# Patient Record
Sex: Male | Born: 1965 | Hispanic: Yes | Marital: Single | Smoking: Never smoker
Health system: Southern US, Community
[De-identification: ages and names within clinical notes are randomized; demographics above are authoritative.]

## PROBLEM LIST (undated history)

## (undated) DIAGNOSIS — F419 Anxiety disorder, unspecified: Secondary | ICD-10-CM

## (undated) DIAGNOSIS — F4310 Post-traumatic stress disorder, unspecified: Secondary | ICD-10-CM

---

## 2016-02-29 DIAGNOSIS — Z5900 Homelessness unspecified: Secondary | ICD-10-CM

## 2016-02-29 NOTE — Congregational Nurse Program (Unsigned)
Congregational Nurse Program Note  Date of Encounter: 02/29/2016  Past Medical History: No past medical history on file.  Encounter Details:     CNP Questionnaire - 02/29/16 1405    Patient Demographics   Is this a new or existing patient? New   Patient is considered a/an Not Applicable   Race Other   Patient Assistance   Location of Patient Assistance Not Applicable   Patient's financial/insurance status Low Income;Self-Pay   Uninsured Patient Yes   Interventions Counseled to make appt. with provider   Patient referred to apply for the following financial assistance [employer]   Food insecurities addressed Provided food supplies   Transportation assistance No   Assistance securing medications No   Educational health offerings Chronic disease;Hypertension   Encounter Details   Primary purpose of visit Education/Health Concerns;Chronic Illness/Condition Visit   Was an Emergency Department visit averted? Not Applicable   Does patient have a medical provider? No   Patient referred to Clinic   Was a mental health screening completed? (GAINS tool) No   Does patient have dental issues? No   Does patient have vision issues? No   Since previous encounter, have you referred patient for abnormal blood pressure that resulted in a new diagnosis or medication change? No   Since previous encounter, have you referred patient for abnormal blood glucose that resulted in a new diagnosis or medication change? No   For Abstraction Use Only   Does patient have insurance? No       B/P check

## 2019-02-06 ENCOUNTER — Encounter (HOSPITAL_BASED_OUTPATIENT_CLINIC_OR_DEPARTMENT_OTHER): Payer: Self-pay | Admitting: Emergency Medicine

## 2019-02-06 ENCOUNTER — Other Ambulatory Visit: Payer: Self-pay

## 2019-02-06 ENCOUNTER — Emergency Department (HOSPITAL_BASED_OUTPATIENT_CLINIC_OR_DEPARTMENT_OTHER)
Admission: EM | Admit: 2019-02-06 | Discharge: 2019-02-06 | Disposition: A | Discharge status: Home / Self Care | Payer: Self-pay | Attending: Emergency Medicine | Admitting: Emergency Medicine

## 2019-02-06 ENCOUNTER — Emergency Department (HOSPITAL_BASED_OUTPATIENT_CLINIC_OR_DEPARTMENT_OTHER): Payer: Self-pay

## 2019-02-06 DIAGNOSIS — G43109 Migraine with aura, not intractable, without status migrainosus: Secondary | ICD-10-CM

## 2019-02-06 HISTORY — DX: Post-traumatic stress disorder, unspecified: F43.10

## 2019-02-06 HISTORY — DX: Anxiety disorder, unspecified: F41.9

## 2019-02-06 LAB — CBC WITH DIFFERENTIAL/PLATELET
Abs Immature Granulocytes: 0.03 10*3/uL (ref 0.00–0.07)
BASOS ABS: 0.1 10*3/uL (ref 0.0–0.1)
BASOS PCT: 1 %
Eosinophils Absolute: 0.2 10*3/uL (ref 0.0–0.5)
Eosinophils Relative: 3 %
HCT: 45.2 % (ref 39.0–52.0)
Hemoglobin: 14.1 g/dL (ref 13.0–17.0)
Immature Granulocytes: 0 %
Lymphocytes Relative: 28 %
Lymphs Abs: 2.1 10*3/uL (ref 0.7–4.0)
MCH: 27 pg (ref 26.0–34.0)
MCHC: 31.2 g/dL (ref 30.0–36.0)
MCV: 86.6 fL (ref 80.0–100.0)
Monocytes Absolute: 0.4 10*3/uL (ref 0.1–1.0)
Monocytes Relative: 5 %
NRBC: 0 % (ref 0.0–0.2)
Neutro Abs: 4.8 10*3/uL (ref 1.7–7.7)
Neutrophils Relative %: 63 %
PLATELETS: 167 10*3/uL (ref 150–400)
RBC: 5.22 MIL/uL (ref 4.22–5.81)
RDW: 12.9 % (ref 11.5–15.5)
WBC: 7.5 10*3/uL (ref 4.0–10.5)

## 2019-02-06 LAB — COMPREHENSIVE METABOLIC PANEL
ALBUMIN: 3.9 g/dL (ref 3.5–5.0)
ALT: 13 U/L (ref 0–44)
AST: 16 U/L (ref 15–41)
Alkaline Phosphatase: 43 U/L (ref 38–126)
Anion gap: 5 (ref 5–15)
BUN: 17 mg/dL (ref 6–20)
CHLORIDE: 104 mmol/L (ref 98–111)
CO2: 26 mmol/L (ref 22–32)
Calcium: 8.9 mg/dL (ref 8.9–10.3)
Creatinine, Ser: 0.97 mg/dL (ref 0.61–1.24)
GFR calc Af Amer: >60 mL/min (ref >60)
GFR calc non Af Amer: >60 mL/min (ref >60)
Glucose, Bld: 114 mg/dL — ABNORMAL HIGH (ref 70–99)
Potassium: 3.6 mmol/L (ref 3.5–5.1)
Sodium: 135 mmol/L (ref 135–145)
Total Bilirubin: 0.6 mg/dL (ref 0.3–1.2)
Total Protein: 6.7 g/dL (ref 6.5–8.1)

## 2019-02-06 MED ORDER — METOCLOPRAMIDE HCL 5 MG/ML IJ SOLN
10.0000 mg | Freq: Once | INTRAMUSCULAR | Status: AC
  Administered 2019-02-06: 10 mg via INTRAVENOUS
  Filled 2019-02-06: qty 2

## 2019-02-06 MED ORDER — DIPHENHYDRAMINE HCL 50 MG/ML IJ SOLN
25.0000 mg | Freq: Once | INTRAMUSCULAR | Status: AC
  Administered 2019-02-06: 25 mg via INTRAVENOUS
  Filled 2019-02-06: qty 1

## 2019-02-06 MED ORDER — SODIUM CHLORIDE 0.9 % IV BOLUS
1000.0000 mL | Freq: Once | INTRAVENOUS | Status: AC
  Administered 2019-02-06: 1000 mL via INTRAVENOUS

## 2019-02-06 MED ORDER — SODIUM CHLORIDE 0.9 % IV SOLN: INTRAVENOUS | Status: DC

## 2019-02-06 MED ORDER — DEXAMETHASONE SODIUM PHOSPHATE 10 MG/ML IJ SOLN
10.0000 mg | Freq: Once | INTRAMUSCULAR | Status: AC
  Administered 2019-02-06: 10 mg via INTRAVENOUS
  Filled 2019-02-06: qty 1

## 2019-02-06 NOTE — ED Provider Notes (Signed)
[facility] EMERGENCY DEPARTMENT Provider Note   CSN: [id number] Arrival date & time: 02/06/19  1444    History   Chief Complaint Chief Complaint  Patient presents with  . Headache  . Numbness    HPI Raymond Proctor is a 53 y.o. male.     Patient with a complaint of headache on her forehead and top of the head bilaterally.  Started this morning at about 9 in the morning initially had some visual flashes.  They have resolved had some tongue numbness and some numbness to both hands upper extremity which is still present.  The headache has persisted has had something similar to this in the past he was told that it may be a migraine.     Past Medical History:  Diagnosis Date  . Anxiety   . PTSD (post-traumatic stress disorder)     There are no active problems to display for this patient.   History reviewed. No pertinent surgical history.      Home Medications    Prior to Admission medications   Not on File    Family History No family history on file.  Social History Social History   Tobacco Use  . Smoking status: Never Smoker  . Smokeless tobacco: Never Used  Substance Use Topics  . Alcohol use: Not Currently  . Drug use: Never     Allergies   Patient has no known allergies.   Review of Systems Review of Systems  Constitutional: Negative for chills and fever.  HENT: Negative for congestion, rhinorrhea and sore throat.   Eyes: Positive for visual disturbance. Negative for photophobia and pain.  Respiratory: Negative for cough and shortness of breath.   Cardiovascular: Negative for chest pain and leg swelling.  Gastrointestinal: Negative for abdominal pain, diarrhea, nausea and vomiting.  Genitourinary: Negative for dysuria.  Musculoskeletal: Negative for back pain and neck pain.  Skin: Negative for rash.  Neurological: Positive for numbness and headaches. Negative for dizziness and light-headedness.  Hematological: Does not bruise/bleed  easily.  Psychiatric/Behavioral: Negative for confusion.     Physical Exam Updated Vital Signs BP (!) 141/90 (BP Location: Left Arm)   Pulse (!) 52   Temp 98.5 F (36.9 C) (Oral)   Resp 16   Ht 1.829 m (6')   Wt 90.7 kg   SpO2 99%   BMI 27.12 kg/m   Physical Exam Vitals signs and nursing note reviewed.  Constitutional:      Appearance: Normal appearance. He is well-developed.  HENT:     Head: Normocephalic and atraumatic.     Nose: No congestion.     Mouth/Throat:     Mouth: Mucous membranes are moist.  Eyes:     Extraocular Movements: Extraocular movements intact.     Conjunctiva/sclera: Conjunctivae normal.     Pupils: Pupils are equal, round, and reactive to light.  Neck:     Musculoskeletal: Normal range of motion and neck supple.  Cardiovascular:     Rate and Rhythm: Normal rate and regular rhythm.     Heart sounds: Normal heart sounds. No murmur.  Pulmonary:     Effort: Pulmonary effort is normal. No respiratory distress.     Breath sounds: Normal breath sounds.  Abdominal:     General: Bowel sounds are normal.     Palpations: Abdomen is soft.     Tenderness: There is no abdominal tenderness.  Musculoskeletal: Normal range of motion.  Skin:    General: Skin is warm and dry.  Capillary Refill: Capillary refill takes less than 2 seconds.  Neurological:     General: No focal deficit present.     Mental Status: He is alert and oriented to person, place, and time.     Cranial Nerves: Cranial nerve deficit present.     Sensory: Sensory deficit present.     Motor: No weakness.      ED Treatments / Results  Labs (all labs ordered are listed, but only abnormal results are displayed) Labs Reviewed  COMPREHENSIVE METABOLIC PANEL - Abnormal; Notable for the following components:      Result Value   Glucose, Bld 114 (*)    All other components within normal limits  CBC WITH DIFFERENTIAL/PLATELET    EKG EKG Interpretation  Date/Time:  Saturday February 06 2019 14:58:28 EST Ventricular Rate:  52 PR Interval:    QRS Duration: 132 QT Interval:  460 QTC Calculation: 428 R Axis:   95 Text Interpretation:  Sinus rhythm Consider left atrial enlargement Nonspecific intraventricular conduction delay No previous ECGs available Confirmed by Vanetta Mulders [phone]) on 02/06/2019 3:19:02 PM   Radiology Ct Head Wo Contrast  Result Date: 02/06/2019 CLINICAL DATA:  Headache EXAM: CT HEAD WITHOUT CONTRAST TECHNIQUE: Contiguous axial images were obtained from the base of the skull through the vertex without intravenous contrast. COMPARISON:  None. FINDINGS: Brain: No acute intracranial abnormality. Specifically, no hemorrhage, hydrocephalus, mass lesion, acute infarction, or significant intracranial injury. Vascular: No hyperdense vessel or unexpected calcification. Skull: No acute calvarial abnormality. Sinuses/Orbits: Mucosal thickening throughout the paranasal sinuses. No acute findings. Other: None IMPRESSION: No acute intracranial abnormality.  Chronic sinusitis. Electronically Signed   By: Charlett Nose M.D.   On: 02/06/2019 16:59    Procedures Procedures (including critical care time)  Medications Ordered in ED Medications  0.9 %  sodium chloride infusion ( Intravenous Hold 02/06/19 1715)  sodium chloride 0.9 % bolus 1,000 mL (0 mLs Intravenous Stopped 02/06/19 1715)  diphenhydrAMINE (BENADRYL) injection 25 mg (25 mg Intravenous Given 02/06/19 1600)  metoCLOPramide (REGLAN) injection 10 mg (10 mg Intravenous Given 02/06/19 1600)  dexamethasone (DECADRON) injection 10 mg (10 mg Intravenous Given 02/06/19 1559)     Initial Impression / Assessment and Plan / ED Course  I have reviewed the triage vital signs and the nursing notes.  Pertinent labs & imaging results that were available during my care of the patient were reviewed by me and considered in my medical decision making (see chart for details).        Patient head CT negative labs without any  significant abnormalities.  Treated with a migraine cocktail 1 L of fluid Decadron Benadryl and Reglan patient states symptoms improving significantly headache is improved but still present but all the numbness to the hands and tongue have resolved.  Clinically suspect patient has a history of complicated migraine.  Patient aware the Decadron will be long-acting.  And will help throughout the night.  Patient does not have to be back to work until Monday.  He will rest in the meantime.    Final Clinical Impressions(s) / ED Diagnoses   Final diagnoses:  Complicated migraine    ED Discharge Orders    None       Vanetta Mulders, MD 02/06/19 1730

## 2019-02-06 NOTE — Discharge Instructions (Addendum)
Rest today and tomorrow.  Return for any new or worse symptoms.  Symptoms seem to be related to a complicated migraine glad that you are feeling better.

## 2019-02-06 NOTE — ED Triage Notes (Addendum)
Pt reports headache, tongue numbness, and seeing flashes since 9am. The numbness lasted 2-3 minutes and has resolved. The headache persists. Info obtained via arabic interpreter.

## 2019-02-08 ENCOUNTER — Other Ambulatory Visit: Payer: Self-pay

## 2019-02-08 ENCOUNTER — Emergency Department (HOSPITAL_BASED_OUTPATIENT_CLINIC_OR_DEPARTMENT_OTHER): Payer: Self-pay

## 2019-02-08 ENCOUNTER — Emergency Department (HOSPITAL_BASED_OUTPATIENT_CLINIC_OR_DEPARTMENT_OTHER)
Admission: EM | Admit: 2019-02-08 | Discharge: 2019-02-08 | Disposition: A | Discharge status: Home / Self Care | Payer: Self-pay | Attending: Emergency Medicine | Admitting: Emergency Medicine

## 2019-02-08 ENCOUNTER — Encounter (HOSPITAL_BASED_OUTPATIENT_CLINIC_OR_DEPARTMENT_OTHER): Payer: Self-pay | Admitting: *Deleted

## 2019-02-08 DIAGNOSIS — R0789 Other chest pain: Secondary | ICD-10-CM | POA: Insufficient documentation

## 2019-02-08 DIAGNOSIS — R002 Palpitations: Secondary | ICD-10-CM | POA: Insufficient documentation

## 2019-02-08 LAB — TROPONIN I: Troponin I: <0.03 ng/mL (ref <0.03)

## 2019-02-08 LAB — URINALYSIS, ROUTINE W REFLEX MICROSCOPIC
Bilirubin Urine: NEGATIVE
Glucose, UA: NEGATIVE mg/dL
Hgb urine dipstick: NEGATIVE
Ketones, ur: NEGATIVE mg/dL
Leukocytes,Ua: NEGATIVE
Nitrite: NEGATIVE
Protein, ur: NEGATIVE mg/dL
Specific Gravity, Urine: >1.03 — ABNORMAL HIGH (ref 1.005–1.030)
pH: 5.5 (ref 5.0–8.0)

## 2019-02-08 LAB — RAPID URINE DRUG SCREEN, HOSP PERFORMED
Amphetamines: NOT DETECTED
BENZODIAZEPINES: NOT DETECTED
Barbiturates: NOT DETECTED
Cocaine: NOT DETECTED
Opiates: NOT DETECTED
Tetrahydrocannabinol: NOT DETECTED

## 2019-02-08 LAB — TSH: TSH: 1.957 u[IU]/mL (ref 0.350–4.500)

## 2019-02-08 MED ORDER — FAMOTIDINE 20 MG PO TABS
40.0000 mg | ORAL_TABLET | Freq: Once | ORAL | Status: AC
  Administered 2019-02-08: 40 mg via ORAL
  Filled 2019-02-08: qty 2

## 2019-02-08 MED ORDER — OMEPRAZOLE 20 MG PO CPDR: 20.0000 mg | DELAYED_RELEASE_CAPSULE | Freq: Every day | ORAL | 1 refills | Status: AC

## 2019-02-08 NOTE — ED Triage Notes (Signed)
Palpitations since last night. He was seen here for migraine 2 days ago.

## 2019-02-08 NOTE — ED Notes (Signed)
ED Provider at bedside. 

## 2019-02-08 NOTE — ED Provider Notes (Signed)
[facility] EMERGENCY DEPARTMENT Provider Note   CSN: [id number] Arrival date & time: 02/08/19  1522    History   Chief Complaint Chief Complaint  Patient presents with  . Palpitations    HPI Raymond Proctor is a 53 y.o. male.     HPI Patient reports he is felt like his heart has been racing since he was treated a day ago for his migraine.  Last night he reports his heart felt like it was beating fast.  He reports sometimes he feels like his chest has kind of a tight sensation to it.  No passing out episodes.  No shortness of breath.  Patient denies cough or fever.  He reports he has no known cardiac history.  No swelling in the legs.  Patient denies tobacco or alcohol use.  Patient reports he does drink coffee every day.  He reports his family history is unknown. Past Medical History:  Diagnosis Date  . Anxiety   . PTSD (post-traumatic stress disorder)     There are no active problems to display for this patient.   History reviewed. No pertinent surgical history.      Home Medications    Prior to Admission medications   Medication Sig Start Date End Date Taking? Authorizing Provider  omeprazole (PRILOSEC) 20 MG capsule Take 1 capsule (20 mg total) by mouth daily. 02/08/19   Arby Barrette, MD    Family History No family history on file.  Social History Social History   Tobacco Use  . Smoking status: Never Smoker  . Smokeless tobacco: Never Used  Substance Use Topics  . Alcohol use: Not Currently  . Drug use: Never     Allergies   Patient has no known allergies.   Review of Systems Review of Systems 10 Systems reviewed and are negative for acute change except as noted in the HPI.   Physical Exam Updated Vital Signs BP 121/75   Pulse 62   Temp 98.1 F (36.7 C) (Oral)   Resp 18   Ht 6' (1.829 m)   Wt 90.7 kg   SpO2 98%   BMI 27.12 kg/m   Physical Exam Constitutional:      Appearance: Normal appearance. He is well-developed.    HENT:     Head: Normocephalic and atraumatic.     Mouth/Throat:     Mouth: Mucous membranes are moist.     Pharynx: Oropharynx is clear.  Eyes:     Pupils: Pupils are equal, round, and reactive to light.  Neck:     Musculoskeletal: Neck supple.     Comments: No thyromegaly. Cardiovascular:     Rate and Rhythm: Normal rate and regular rhythm.     Heart sounds: Normal heart sounds.  Pulmonary:     Effort: Pulmonary effort is normal.     Breath sounds: Normal breath sounds.  Abdominal:     General: Bowel sounds are normal. There is no distension.     Palpations: Abdomen is soft.     Tenderness: There is no abdominal tenderness.  Musculoskeletal: Normal range of motion.  Skin:    General: Skin is warm and dry.  Neurological:     General: No focal deficit present.     Mental Status: He is alert and oriented to person, place, and time.     GCS: GCS eye subscore is 4. GCS verbal subscore is 5. GCS motor subscore is 6.     Coordination: Coordination normal.  Psychiatric:  Mood and Affect: Mood normal.      ED Treatments / Results  Labs (all labs ordered are listed, but only abnormal results are displayed) Labs Reviewed  URINALYSIS, ROUTINE W REFLEX MICROSCOPIC - Abnormal; Notable for the following components:      Result Value   Specific Gravity, Urine >1.030 (*)    All other components within normal limits  RAPID URINE DRUG SCREEN, HOSP PERFORMED  TROPONIN I  TSH    EKG EKG Interpretation  Date/Time:  Monday February 08 2019 15:36:34 EDT Ventricular Rate:  62 PR Interval:  130 QRS Duration: 118 QT Interval:  416 QTC Calculation: 422 R Axis:   60 Text Interpretation:  Normal sinus rhythm Non-specific intra-ventricular conduction delay Borderline ECG no sig change from previous Confirmed by Arby Barrette [phone]) on 02/08/2019 3:55:43 PM   Radiology Dg Chest 2 View  Result Date: 02/08/2019 CLINICAL DATA:  Chest pain and palpitations EXAM: CHEST - 2 VIEW  COMPARISON:  None. FINDINGS: The heart size and mediastinal contours are within normal limits. Both lungs are clear. The visualized skeletal structures are unremarkable. IMPRESSION: No active cardiopulmonary disease. Electronically Signed   By: Deatra Robinson M.D.   On: 02/08/2019 16:46    Procedures Procedures (including critical care time)  Medications Ordered in ED Medications  famotidine (PEPCID) tablet 40 mg (40 mg Oral Given 02/08/19 1612)     Initial Impression / Assessment and Plan / ED Course  I have reviewed the triage vital signs and the nursing notes.  Pertinent labs & imaging results that were available during my care of the patient were reviewed by me and considered in my medical decision making (see chart for details).        Patient clinically well in appearance.  Heart is regular with normal sinus rhythm.  Monitor shows rates in the low 60s.  EKG has no ischemic appearing changes.  Troponin is negative.  Patient symptoms do not sound suggestive of ischemia.  Patient does not have hypertension or other risk factors immediately identifiable.  His family history however is not recalled by him.  At this time I feel patient is stable for outpatient management and follow-up.  He will be counseled on getting a primary care doctor for routine health management.  Return precautions reviewed.  Counseled on avoiding caffeine and other stimulants.  Final Clinical Impressions(s) / ED Diagnoses   Final diagnoses:  Palpitations  Other chest pain    ED Discharge Orders         Ordered    omeprazole (PRILOSEC) 20 MG capsule  Daily     02/08/19 1716           Arby Barrette, MD 02/08/19 [phone]

## 2019-02-08 NOTE — Discharge Instructions (Signed)
1.  Start taking Prilosec daily for 2 weeks. 2.  Stop drinking coffee or other foods that contain caffeine. 3.  Schedule a follow-up with a family doctor.  You may go to [facility] as listed in your discharge instructions or use the referral number to find a family doctor. 4.  Return to the emergency department if your symptoms are worsening or changing.

## 2020-07-15 IMAGING — CT CT HEAD W/O CM
3 of 4 series · 16 of 47 positions shown, 19 images · non-contrast
Comparison: None.

CLINICAL DATA: Headache

EXAM:
CT HEAD WITHOUT CONTRAST
TECHNIQUE: Contiguous axial images were obtained from the base of the skull
through the vertex without intravenous contrast.

[Series 2: head wo · axial · 0.51mm/px · z∈[-179,-44]mm · 10 of 33 slices shown, 13 images]
[im 3/33  brain]
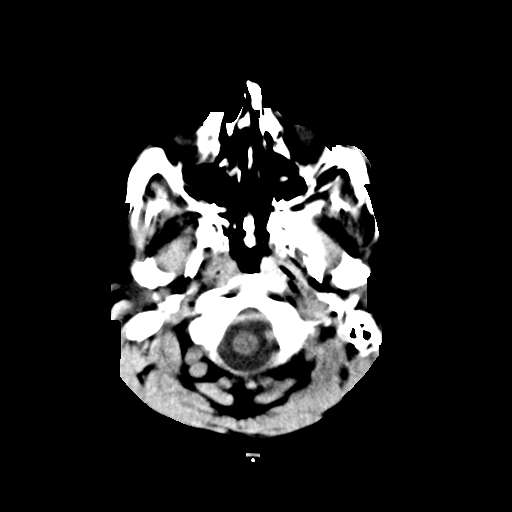
[im 3/33  bone]
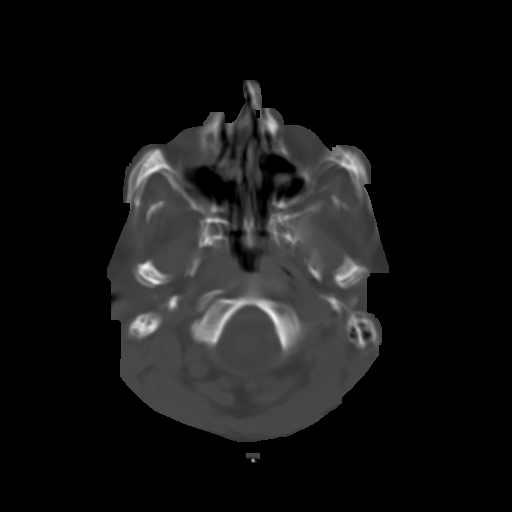
[im 5/33  brain]
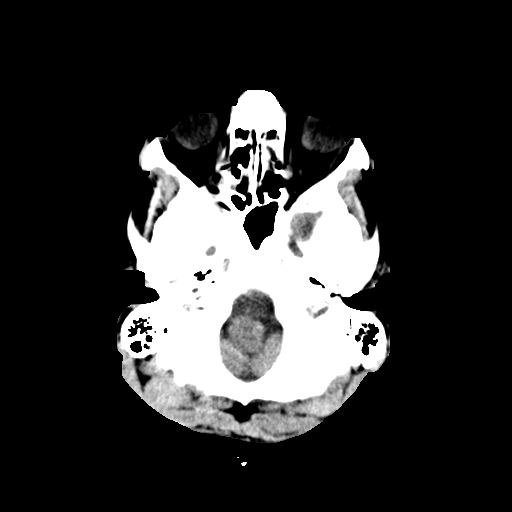
[im 10/33  brain]
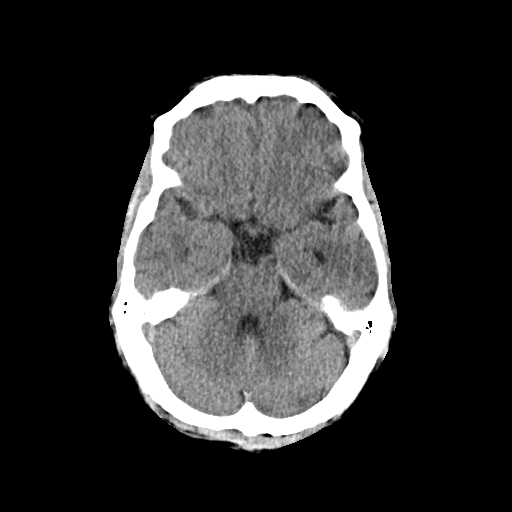
[im 12/33  brain]
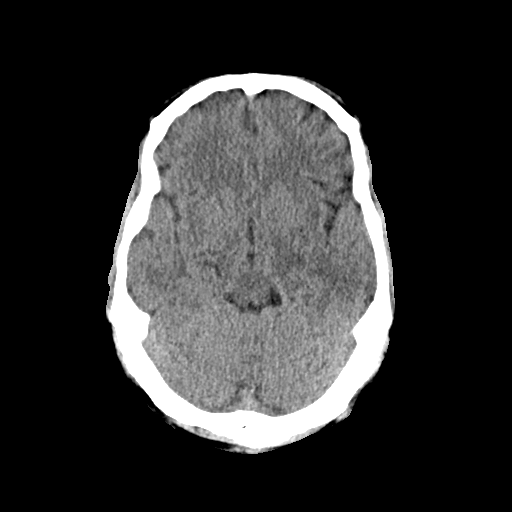
[im 14/33  brain]
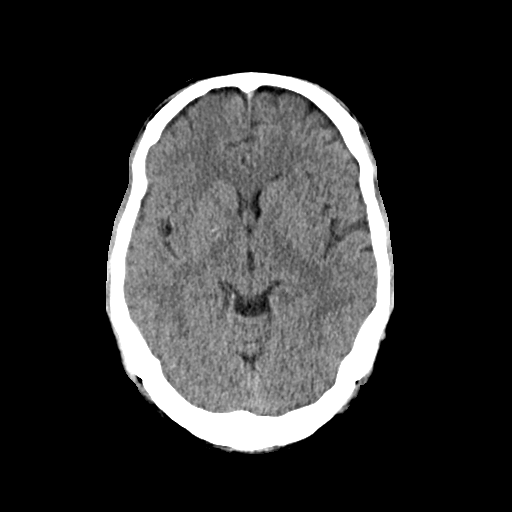
[im 14/33  bone]
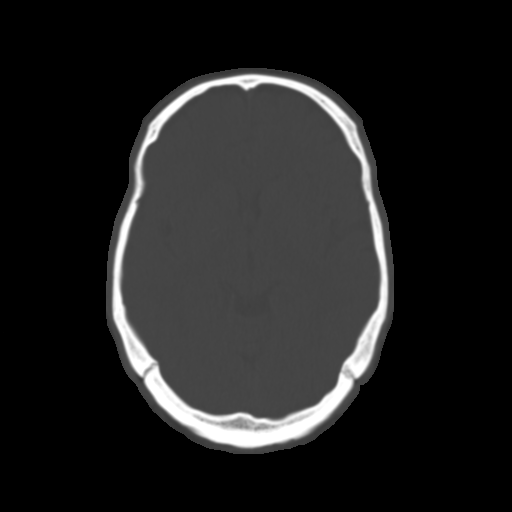
[im 19/33  brain]
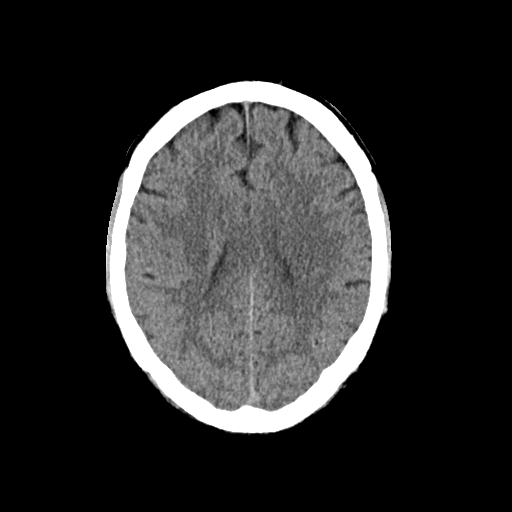
[im 21/33  brain]
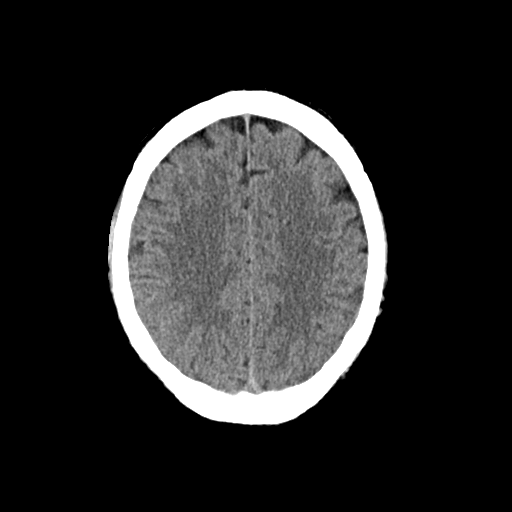
[im 23/33  brain]
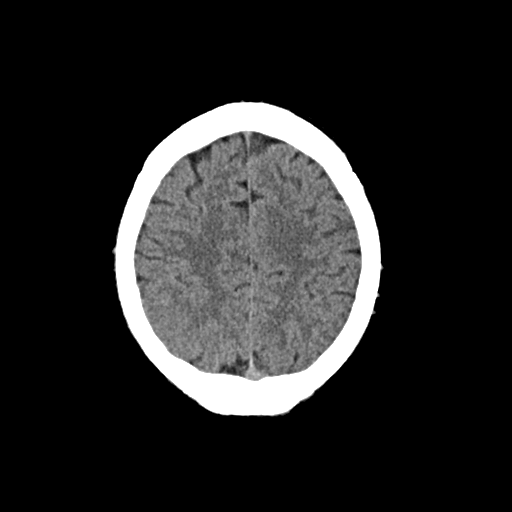
[im 28/33  brain]
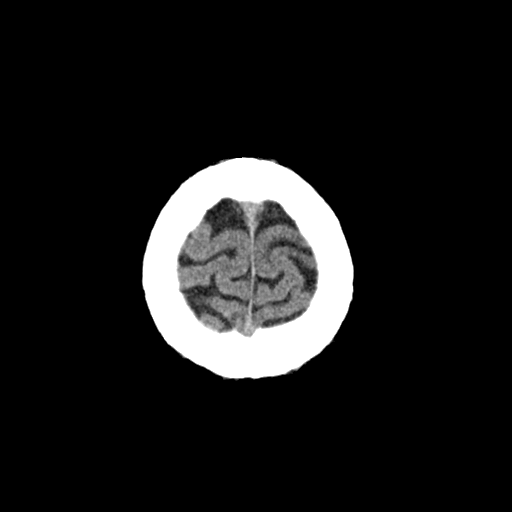
[im 28/33  bone]
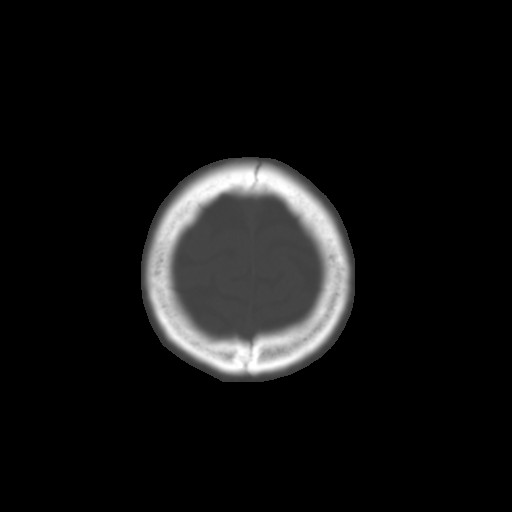
[im 30/33  brain]
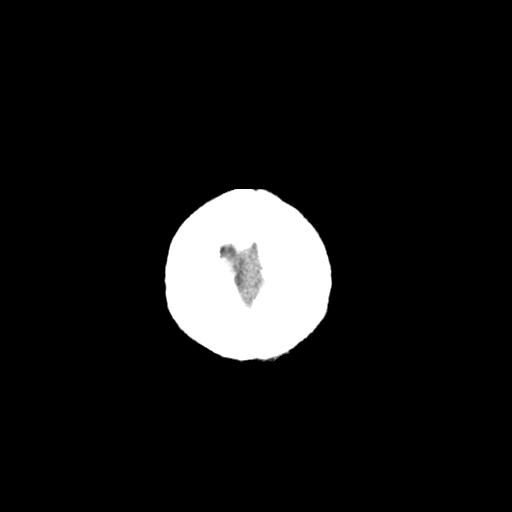

[Series 6: coronal soft · coronal · 0.39mm/px · 3 of 66 slices shown]
[im 22/66  brain]
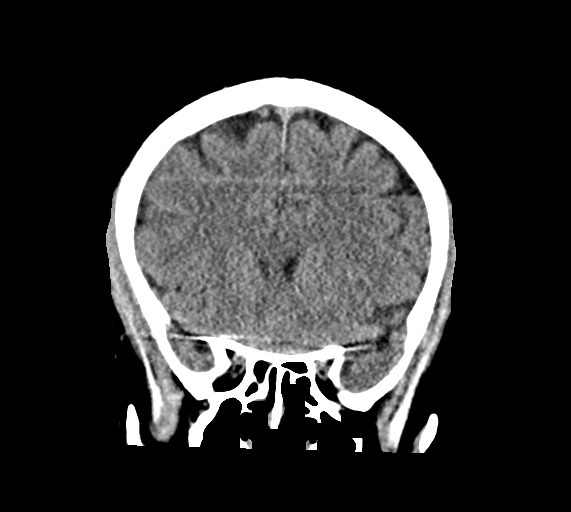
[im 29/66  brain]
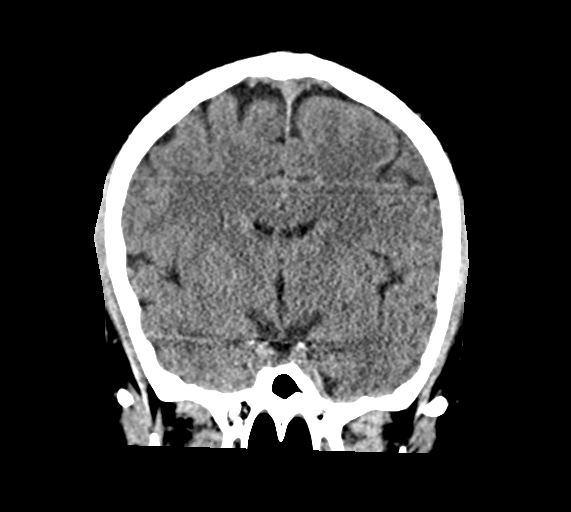
[im 37/66  brain]
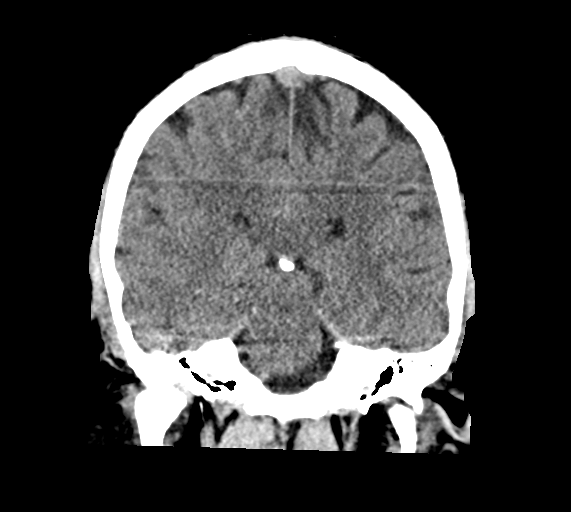

[Series 7: sag soft · sagittal · 0.39mm/px · 3 of 59 slices shown]
[im 20/59  brain]
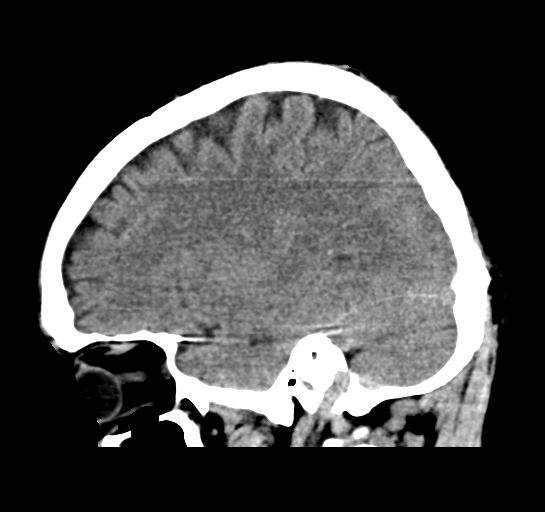
[im 30/59  brain]
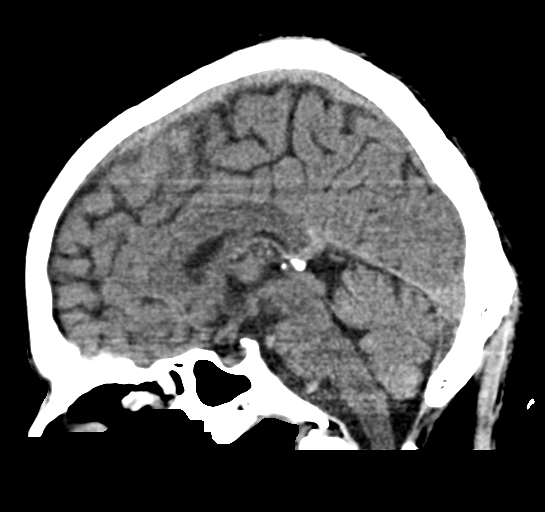
[im 39/59  brain]
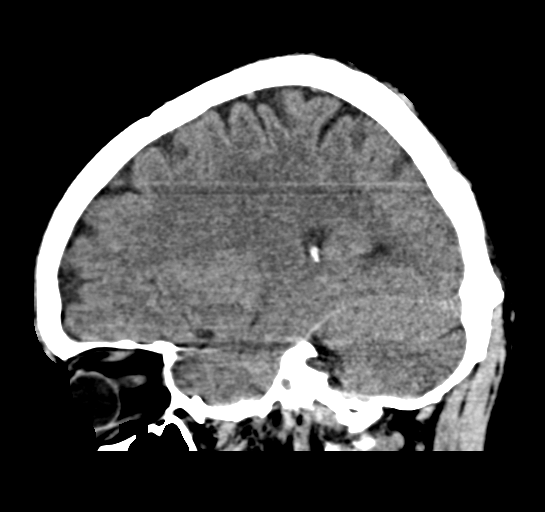

[16 of 47 positions shown; findings below may reference images not displayed]

FINDINGS: Brain: No acute intracranial abnormality. Specifically, no
hemorrhage, hydrocephalus, mass lesion, acute infarction, or
significant intracranial injury.

Vascular: No hyperdense vessel or unexpected calcification.

Skull: No acute calvarial abnormality.

Sinuses/Orbits: Mucosal thickening throughout the paranasal sinuses.
No acute findings.

Other: None
IMPRESSION: No acute intracranial abnormality.  Chronic sinusitis.

## 2020-07-17 IMAGING — CR CHEST - 2 VIEW
2 series · 2 of 2 positions shown · non-contrast
Comparison: None.

CLINICAL DATA: Chest pain and palpitations

EXAM:
CHEST - 2 VIEW

[w chest pa]
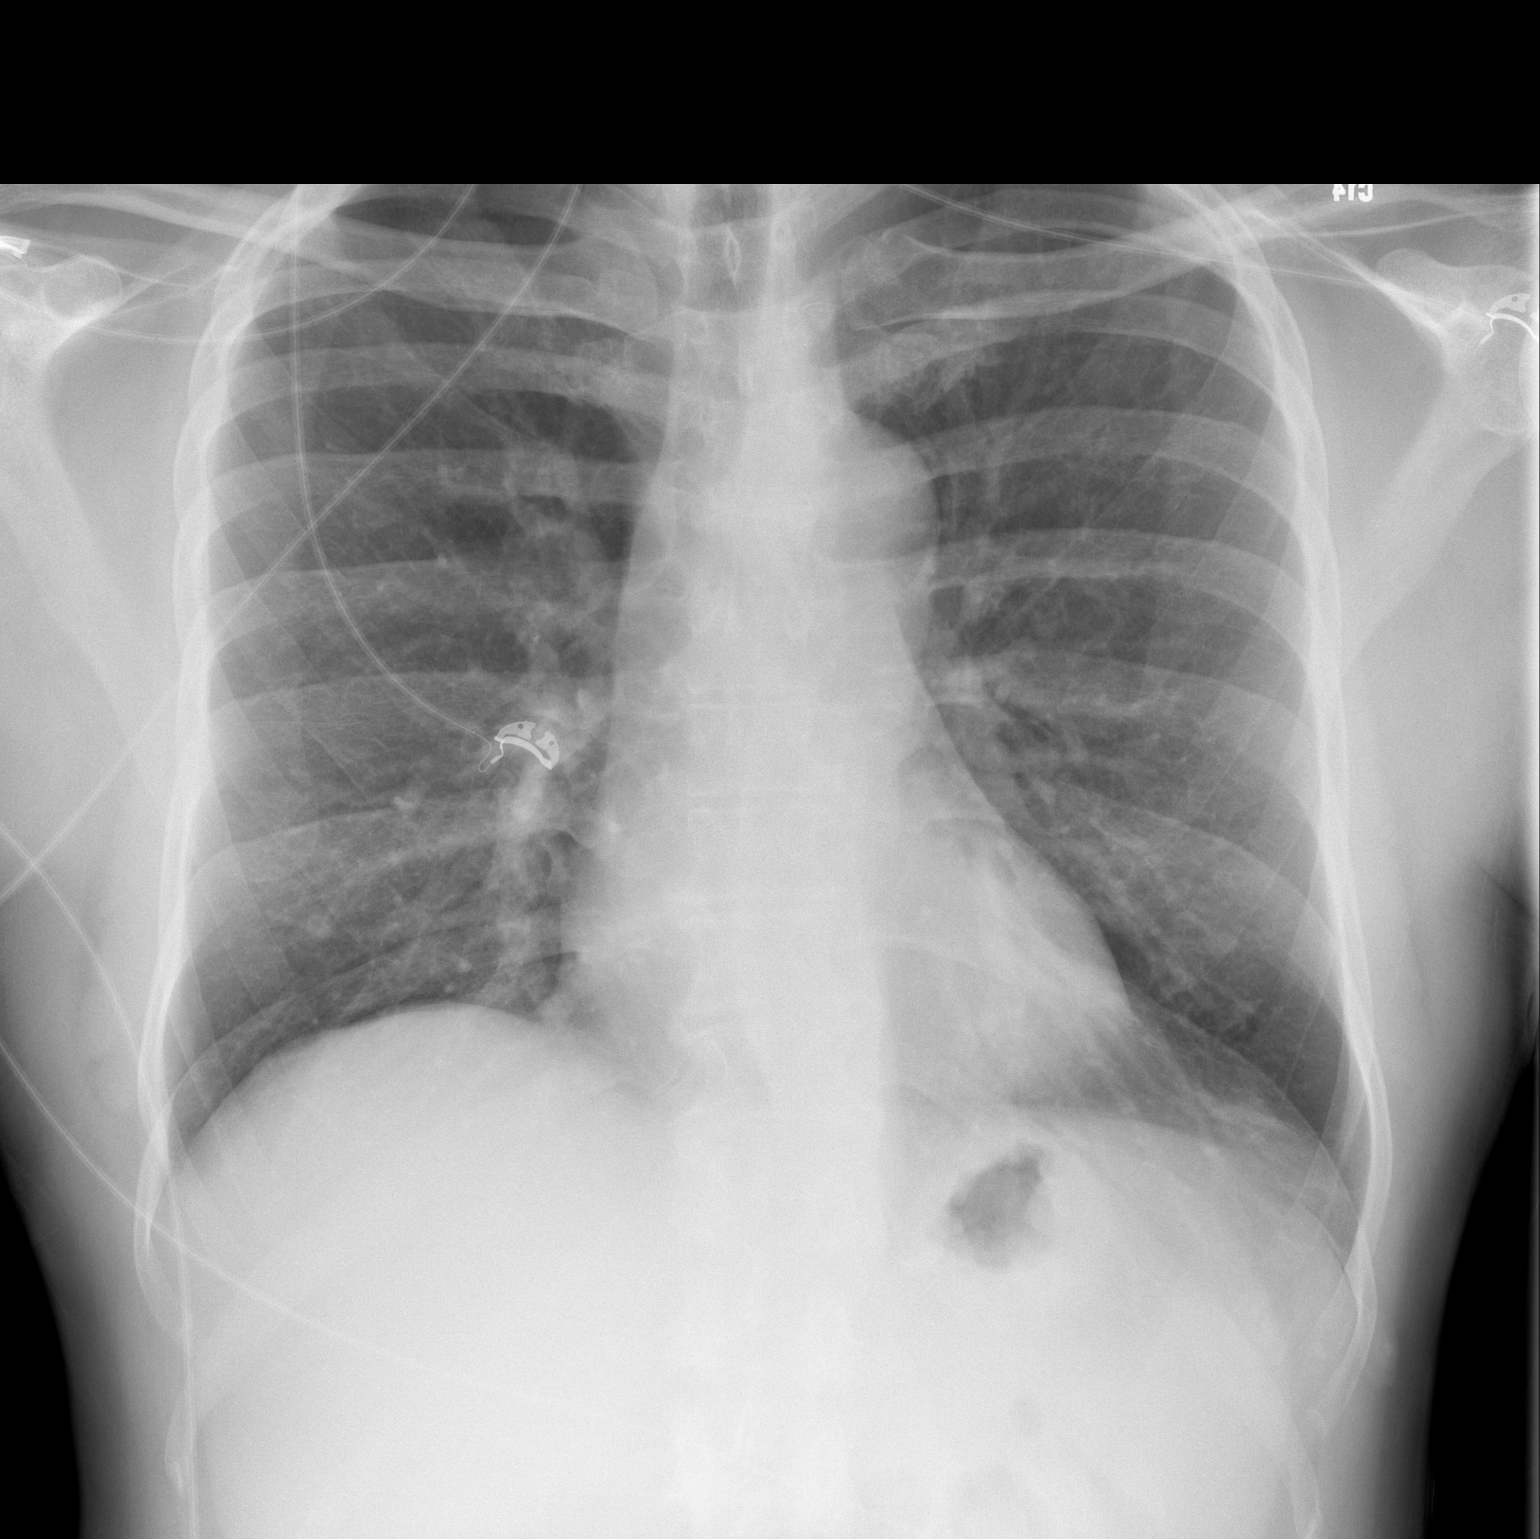

[w chest lat]
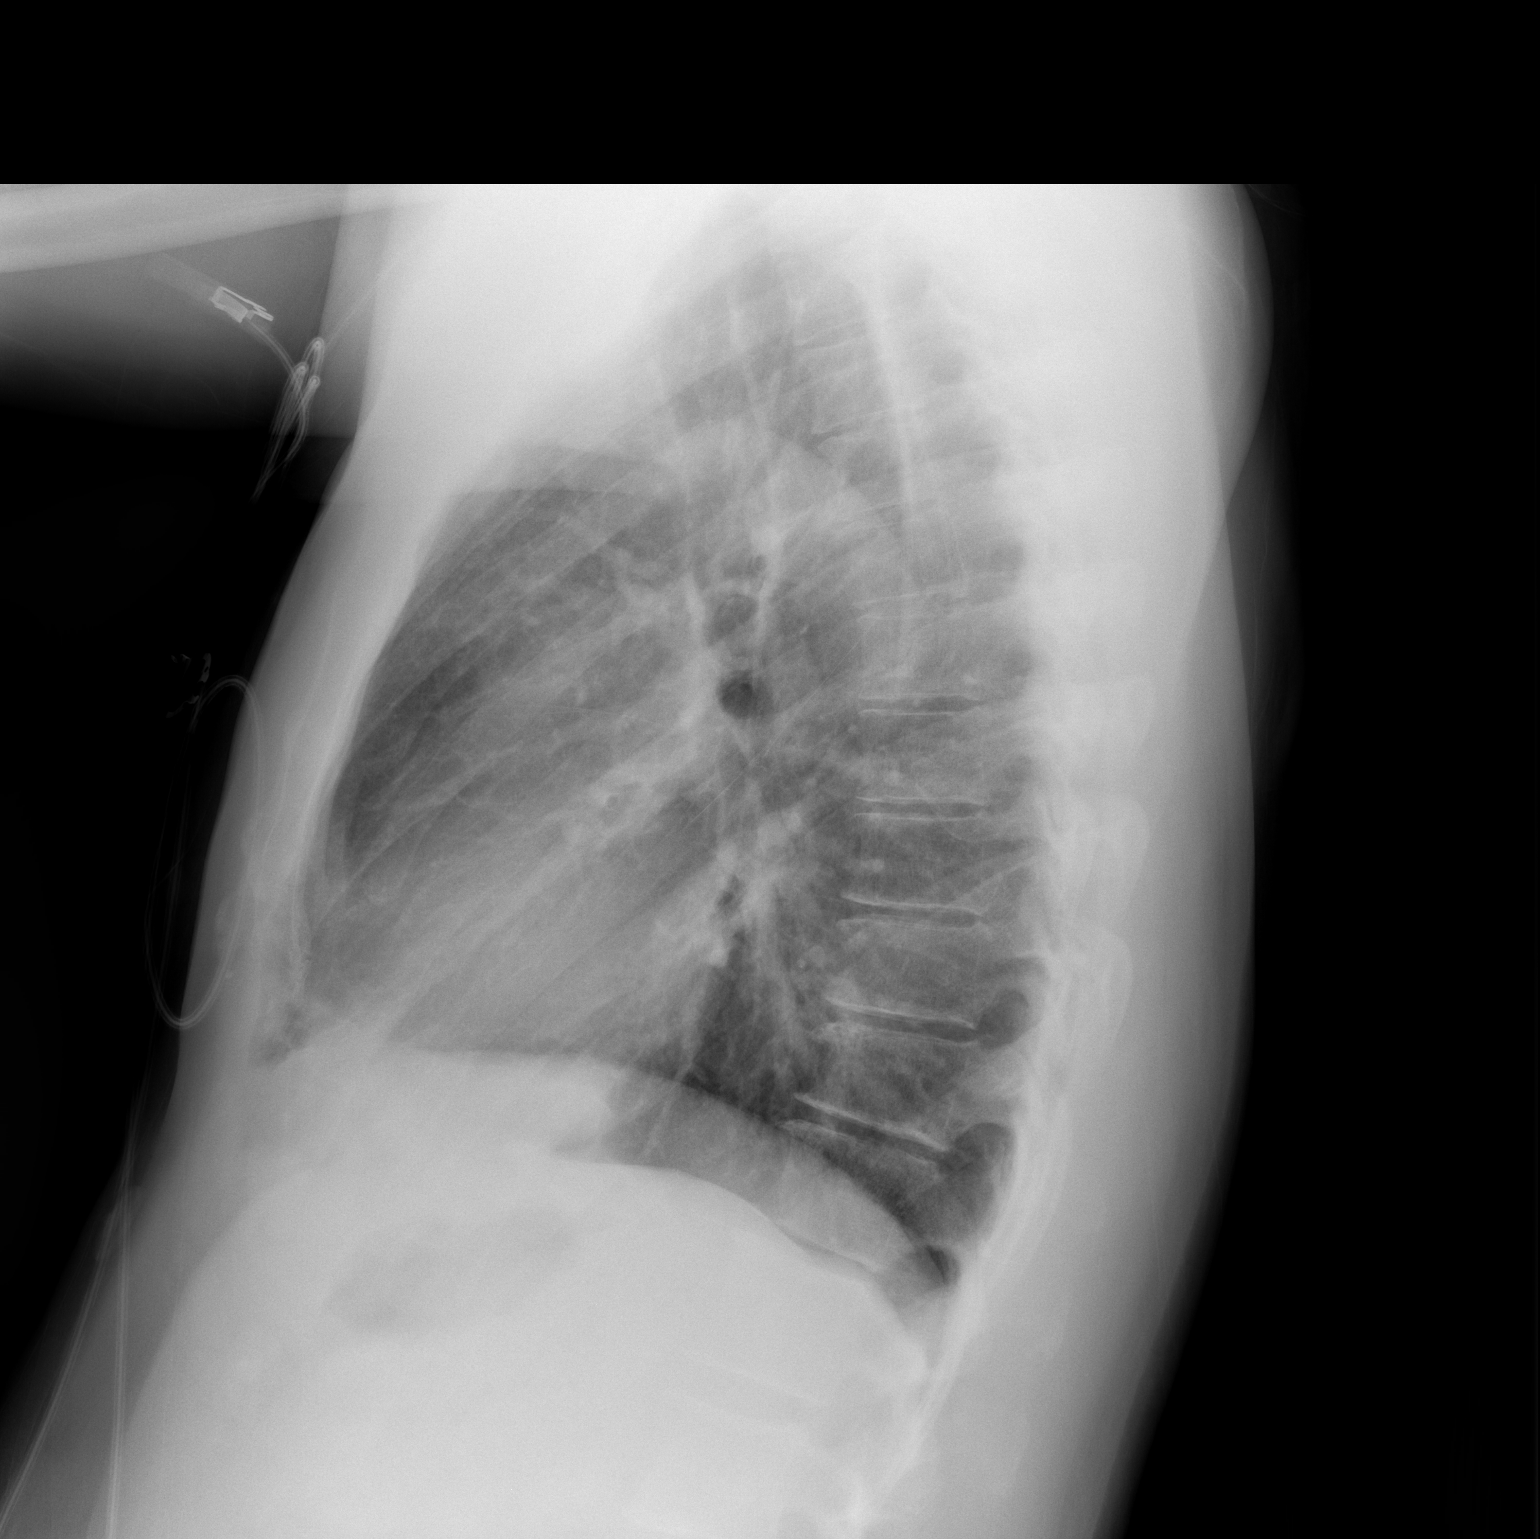

[2 of 2 positions shown; findings below may reference images not displayed]

FINDINGS: The heart size and mediastinal contours are within normal limits.
Both lungs are clear. The visualized skeletal structures are
unremarkable.
IMPRESSION: No active cardiopulmonary disease.
